# Patient Record
Sex: Female | Born: 1990 | Race: White | Hispanic: No | Marital: Single | Smoking: Current every day smoker
Health system: Southern US, Community
[De-identification: ages and names within clinical notes are randomized; demographics above are authoritative.]

## PROBLEM LIST (undated history)

## (undated) DIAGNOSIS — D649 Anemia, unspecified: Secondary | ICD-10-CM

## (undated) HISTORY — PX: CHOLECYSTECTOMY: SHX55

---

## 2013-05-02 ENCOUNTER — Encounter (HOSPITAL_BASED_OUTPATIENT_CLINIC_OR_DEPARTMENT_OTHER): Payer: Self-pay | Admitting: *Deleted

## 2013-05-02 ENCOUNTER — Emergency Department (HOSPITAL_BASED_OUTPATIENT_CLINIC_OR_DEPARTMENT_OTHER)
Admission: EM | Admit: 2013-05-02 | Discharge: 2013-05-03 | Disposition: A | Discharge status: Home / Self Care | Payer: Self-pay | Attending: Emergency Medicine | Admitting: Emergency Medicine

## 2013-05-02 ENCOUNTER — Emergency Department (HOSPITAL_BASED_OUTPATIENT_CLINIC_OR_DEPARTMENT_OTHER): Payer: Self-pay

## 2013-05-02 DIAGNOSIS — Y939 Activity, unspecified: Secondary | ICD-10-CM | POA: Insufficient documentation

## 2013-05-02 DIAGNOSIS — S90211A Contusion of right great toe with damage to nail, initial encounter: Secondary | ICD-10-CM

## 2013-05-02 DIAGNOSIS — S90129A Contusion of unspecified lesser toe(s) without damage to nail, initial encounter: Secondary | ICD-10-CM | POA: Insufficient documentation

## 2013-05-02 DIAGNOSIS — Z862 Personal history of diseases of the blood and blood-forming organs and certain disorders involving the immune mechanism: Secondary | ICD-10-CM | POA: Insufficient documentation

## 2013-05-02 DIAGNOSIS — F17200 Nicotine dependence, unspecified, uncomplicated: Secondary | ICD-10-CM | POA: Insufficient documentation

## 2013-05-02 DIAGNOSIS — W208XXA Other cause of strike by thrown, projected or falling object, initial encounter: Secondary | ICD-10-CM | POA: Insufficient documentation

## 2013-05-02 DIAGNOSIS — Y9289 Other specified places as the place of occurrence of the external cause: Secondary | ICD-10-CM | POA: Insufficient documentation

## 2013-05-02 DIAGNOSIS — Y990 Civilian activity done for income or pay: Secondary | ICD-10-CM | POA: Insufficient documentation

## 2013-05-02 HISTORY — DX: Anemia, unspecified: D64.9

## 2013-05-02 NOTE — ED Notes (Addendum)
Pt states she dropped something on her toe earlier today. Bruising noted. Ice applied.

## 2013-05-03 MED ORDER — HYDROCODONE-ACETAMINOPHEN 5-325 MG PO TABS: 1.0000 | ORAL_TABLET | Freq: Four times a day (QID) | ORAL | Status: AC | PRN

## 2013-05-03 MED ORDER — HYDROCODONE-ACETAMINOPHEN 5-325 MG PO TABS
1.0000 | ORAL_TABLET | Freq: Once | ORAL | Status: AC
  Administered 2013-05-03: 1 via ORAL
  Filled 2013-05-03: qty 1

## 2013-05-03 NOTE — ED Provider Notes (Signed)
History     CSN: [id number]  Arrival date & time 05/02/13  2136   First MD Initiated Contact with Patient 05/03/13 0108      Chief Complaint  Patient presents with  . Toe Injury    (Consider location/radiation/quality/duration/timing/severity/associated sxs/prior treatment) HPI This is a 22 year old female who dropped a container on her right great toe at work yesterday afternoon. There is mild to moderate pain in that toe, worse with ambulation. There is associated subungual hematoma but no deformity. Motor and sensory function are intact. She denies other injury.  Past Medical History  Diagnosis Date  . Anemia     Past Surgical History  Procedure Laterality Date  . Cholecystectomy      History reviewed. No pertinent family history.  History  Substance Use Topics  . Smoking status: Current Every Day Smoker  . Smokeless tobacco: Not on file  . Alcohol Use: No    OB History   Grav Para Term Preterm Abortions TAB SAB Ect Mult Living                  Review of Systems  All other systems reviewed and are negative.    Allergies  Review of patient's allergies indicates no known allergies.  Home Medications  No current outpatient prescriptions on file.  BP 109/70  Pulse 88  Temp(Src) 98.4 F (36.9 C) (Oral)  Ht 5\' 3"  (1.6 m)  Wt 97 lb (43.999 kg)  BMI 17.19 kg/m2  SpO2 96%  LMP 04/18/2013  Physical Exam General: Well-developed, well-nourished female in no acute distress; appearance consistent with age of record HENT: normocephalic, atraumatic Eyes: Normal appearance Neck: supple Heart: regular rate and rhythm Lungs: clear to auscultation bilaterally Abdomen: soft; nondistended Extremities: No deformity; full range of motion; pulses normal; tender right great toe with subungual hematoma but no swelling, deformity or ecchymosis; toes distally neurovascularly intact Neurologic: Awake, alert and oriented; motor function intact in all extremities and  symmetric; no facial droop Skin: Warm and dry Psychiatric: Anxious    ED Course  Procedures (including critical care time)  DRAINAGE OF SUBUNGUAL HEMATOMA The patient's right great toe subungual hematoma was drained using a standard pen cautery. There was release of a moderate amount of dark blood the patient tolerated this well there were no immediate complications.  MDM  Nursing notes and vitals signs, including pulse oximetry, reviewed.  Summary of this visit's results, reviewed by myself:  Imaging Studies: Dg Toe Great Right  05/02/2013  *RADIOLOGY REPORT*  Clinical Data: First digit pain status post trauma  RIGHT GREAT TOE  Comparison: None.  Findings: No displaced fracture.  No dislocation.  No aggressive osseous lesions.  IMPRESSION: No acute osseous finding of the first digit right foot.  If clinical concern for a fracture persists, recommend a repeat radiograph in 5-10 days to evaluate for interval change or callus formation.   Original Report Authenticated By: Jearld Lesch, M.D.             Hanley Seamen, MD 05/03/13 [phone]

## 2017-01-20 ENCOUNTER — Emergency Department (HOSPITAL_BASED_OUTPATIENT_CLINIC_OR_DEPARTMENT_OTHER)
Admission: EM | Admit: 2017-01-20 | Discharge: 2017-01-21 | Disposition: A | Discharge status: Home / Self Care | Payer: BLUE CROSS/BLUE SHIELD | Attending: Emergency Medicine | Admitting: Emergency Medicine

## 2017-01-20 ENCOUNTER — Encounter (HOSPITAL_BASED_OUTPATIENT_CLINIC_OR_DEPARTMENT_OTHER): Payer: Self-pay | Admitting: Emergency Medicine

## 2017-01-20 DIAGNOSIS — R0789 Other chest pain: Secondary | ICD-10-CM | POA: Diagnosis present

## 2017-01-20 DIAGNOSIS — F17200 Nicotine dependence, unspecified, uncomplicated: Secondary | ICD-10-CM | POA: Diagnosis not present

## 2017-01-20 DIAGNOSIS — M549 Dorsalgia, unspecified: Secondary | ICD-10-CM | POA: Insufficient documentation

## 2017-01-20 DIAGNOSIS — R197 Diarrhea, unspecified: Secondary | ICD-10-CM | POA: Insufficient documentation

## 2017-01-20 DIAGNOSIS — R1110 Vomiting, unspecified: Secondary | ICD-10-CM | POA: Insufficient documentation

## 2017-01-20 DIAGNOSIS — D649 Anemia, unspecified: Secondary | ICD-10-CM | POA: Diagnosis not present

## 2017-01-20 LAB — URINALYSIS, ROUTINE W REFLEX MICROSCOPIC
GLUCOSE, UA: NEGATIVE mg/dL
Ketones, ur: NEGATIVE mg/dL
LEUKOCYTES UA: NEGATIVE
Nitrite: NEGATIVE
PH: 6 (ref 5.0–8.0)
PROTEIN: NEGATIVE mg/dL
Specific Gravity, Urine: 1.029 (ref 1.005–1.030)

## 2017-01-20 LAB — URINALYSIS, MICROSCOPIC (REFLEX)

## 2017-01-20 LAB — PREGNANCY, URINE: Preg Test, Ur: NEGATIVE

## 2017-01-20 NOTE — ED Triage Notes (Signed)
Patient reports that she is having spasms to her chest and to her upper back. The patient states that she has had this going on for the last 3 hours

## 2017-01-21 ENCOUNTER — Emergency Department (HOSPITAL_BASED_OUTPATIENT_CLINIC_OR_DEPARTMENT_OTHER): Payer: BLUE CROSS/BLUE SHIELD

## 2017-01-21 ENCOUNTER — Other Ambulatory Visit: Payer: Self-pay | Admitting: Oncology

## 2017-01-21 DIAGNOSIS — D561 Beta thalassemia: Secondary | ICD-10-CM

## 2017-01-21 DIAGNOSIS — D509 Iron deficiency anemia, unspecified: Secondary | ICD-10-CM

## 2017-01-21 LAB — CBC WITH DIFFERENTIAL/PLATELET
Basophils Absolute: 0 10*3/uL (ref 0.0–0.1)
Basophils Relative: 0 %
EOS PCT: 1 %
Eosinophils Absolute: 0.1 10*3/uL (ref 0.0–0.7)
HEMATOCRIT: 27.6 % — AB (ref 36.0–46.0)
HEMOGLOBIN: 8.5 g/dL — AB (ref 12.0–15.0)
LYMPHS ABS: 0.7 10*3/uL (ref 0.7–4.0)
LYMPHS PCT: 11 %
MCH: 18.4 pg — ABNORMAL LOW (ref 26.0–34.0)
MCHC: 30.8 g/dL (ref 30.0–36.0)
MCV: 59.7 fL — ABNORMAL LOW (ref 78.0–100.0)
Monocytes Absolute: 0.3 10*3/uL (ref 0.1–1.0)
Monocytes Relative: 5 %
NEUTROS PCT: 83 %
Neutro Abs: 5.2 10*3/uL (ref 1.7–7.7)
Platelets: 353 10*3/uL (ref 150–400)
RBC: 4.62 MIL/uL (ref 3.87–5.11)
RDW: 22 % — ABNORMAL HIGH (ref 11.5–15.5)
WBC: 6.3 10*3/uL (ref 4.0–10.5)

## 2017-01-21 LAB — COMPREHENSIVE METABOLIC PANEL
ALBUMIN: 4.8 g/dL (ref 3.5–5.0)
ALK PHOS: 41 U/L (ref 38–126)
ALT: 21 U/L (ref 14–54)
AST: 23 U/L (ref 15–41)
Anion gap: 8 (ref 5–15)
BILIRUBIN TOTAL: 1.5 mg/dL — AB (ref 0.3–1.2)
BUN: 13 mg/dL (ref 6–20)
CALCIUM: 8.9 mg/dL (ref 8.9–10.3)
CO2: 26 mmol/L (ref 22–32)
Chloride: 102 mmol/L (ref 101–111)
Creatinine, Ser: 0.5 mg/dL (ref 0.44–1.00)
GFR calc Af Amer: >60 mL/min (ref >60)
GFR calc non Af Amer: >60 mL/min (ref >60)
GLUCOSE: 111 mg/dL — AB (ref 65–99)
Potassium: 3.8 mmol/L (ref 3.5–5.1)
Sodium: 136 mmol/L (ref 135–145)
Total Protein: 8.2 g/dL — ABNORMAL HIGH (ref 6.5–8.1)

## 2017-01-21 LAB — IRON AND TIBC
Iron: 50 ug/dL (ref 28–170)
Saturation Ratios: 16 % (ref 10.4–31.8)
TIBC: 305 ug/dL (ref 250–450)
UIBC: 255 ug/dL

## 2017-01-21 LAB — D-DIMER, QUANTITATIVE: D-Dimer, Quant: <0.27 ug/mL-FEU (ref 0.00–0.50)

## 2017-01-21 LAB — FERRITIN: Ferritin: 66 ng/mL (ref 11–307)

## 2017-01-21 LAB — VITAMIN B12: Vitamin B-12: 420 pg/mL (ref 180–914)

## 2017-01-21 LAB — DIRECT ANTIGLOBULIN TEST (NOT AT ARMC)
DAT, IgG: NEGATIVE
DAT, complement: NEGATIVE

## 2017-01-21 LAB — LACTATE DEHYDROGENASE: LDH: 122 U/L (ref 98–192)

## 2017-01-21 LAB — RETICULOCYTES
RBC.: 4.72 MIL/uL (ref 3.87–5.11)
RETIC COUNT ABSOLUTE: 160.5 10*3/uL (ref 19.0–186.0)
Retic Ct Pct: 3.4 % — ABNORMAL HIGH (ref 0.4–3.1)

## 2017-01-21 LAB — LIPASE, BLOOD: Lipase: 18 U/L (ref 11–51)

## 2017-01-21 LAB — FOLATE: FOLATE: 6.6 ng/mL (ref >5.9)

## 2017-01-21 MED ORDER — ONDANSETRON HCL 4 MG PO TABS: 4.0000 mg | ORAL_TABLET | Freq: Four times a day (QID) | ORAL | 0 refills | Status: AC

## 2017-01-21 NOTE — ED Notes (Signed)
Pt able to drink without vomiting. Denies pain.

## 2017-01-21 NOTE — Discharge Instructions (Signed)
There is no evidence of heart attack or blood clot in the lung. Follow up with the hematologist regarding your anemia. Return to the ED if you develop new or worsening symptoms.

## 2017-01-21 NOTE — ED Provider Notes (Signed)
[facility] Provider Note   CSN: [id number] Arrival date & time: 01/20/17  2153  By signing my name below, I, Teofilo Pod, attest that this documentation has been prepared under the direction and in the presence of Glynn Octave, MD . Electronically Signed: Teofilo Pod, ED Scribe. 01/21/2017. 12:08 AM.    History   Chief Complaint Chief Complaint  Patient presents with  . Flank Pain     The history is provided by the patient. No language interpreter was used.   HPI Comments:  Pamela Tyler is a 26 y.o. female who presents to the Emergency Department complaining of intermittent back and chest pain x 3 days. Pt complains of associated diarrhea (1 x today, 4 x yesterday), vomiting (3 x  Today). Pt describes the pain as "sharp cramps," notes that it is made worse with breathing, and states that it lasts for 15-20 minute episodes. No alleviating factors noted.  Pt reports sick contact with her children. Pt denies recent long travel, antibiotic use, birth control. Pt reports PSHx of cholecystectomy. Pt denies cough, rhinorrhea, sore throat, dysuria, hematuria, SOB, abdominal pain, vaginal bleeding, vaginal discharge.    Past Medical History:  Diagnosis Date  . Anemia     There are no active problems to display for this patient.   Past Surgical History:  Procedure Laterality Date  . CHOLECYSTECTOMY      OB History    No data available       Home Medications    Prior to Admission medications   Medication Sig Start Date End Date Taking? Authorizing Provider  HYDROcodone-acetaminophen (NORCO/VICODIN) 5-325 MG per tablet Take 1-2 tablets by mouth every 6 (six) hours as needed for pain. 05/03/13   Paula Libra, MD    Family History History reviewed. No pertinent family history.  Social History Social History  Substance Use Topics  . Smoking status: Current Every Day Smoker  . Smokeless tobacco: Never Used  . Alcohol use No      Allergies   Patient has no known allergies.   Review of Systems Review of Systems  HENT: Negative for rhinorrhea and sore throat.   Respiratory: Negative for cough and shortness of breath.   Cardiovascular: Positive for chest pain.  Gastrointestinal: Positive for diarrhea and vomiting. Negative for abdominal pain.  Genitourinary: Negative for dysuria, hematuria, vaginal bleeding and vaginal discharge.  Musculoskeletal: Positive for back pain.  All other systems reviewed and are negative.    Physical Exam Updated Vital Signs BP 103/82 (BP Location: Left Arm)   Pulse 99   Temp 98.4 F (36.9 C) (Oral)   Resp 22   Ht 5\' 3"  (1.6 m)   Wt 96 lb (43.5 kg)   LMP 01/13/2017   SpO2 100%   BMI 17.01 kg/m   Physical Exam  Constitutional: She is oriented to person, place, and time. She appears well-developed and well-nourished. No distress.  HENT:  Head: Normocephalic and atraumatic.  Mouth/Throat: Oropharynx is clear and moist. No oropharyngeal exudate.  Eyes: Conjunctivae and EOM are normal. Pupils are equal, round, and reactive to light.  Neck: Normal range of motion. Neck supple.  No meningismus.  Cardiovascular: Normal rate, regular rhythm, normal heart sounds and intact distal pulses.   No murmur heard. Pulmonary/Chest: Effort normal and breath sounds normal. No respiratory distress.  Abdominal: Soft. There is no tenderness. There is no rebound and no guarding.  Musculoskeletal: Normal range of motion. She exhibits no edema or tenderness.  Paraspinal thoracic  tenderness, equal grip strength, radial pulses intact.  Neurological: She is alert and oriented to person, place, and time. No cranial nerve deficit. She exhibits normal muscle tone. Coordination normal.  No ataxia on finger to nose bilaterally. No pronator drift. 5/5 strength throughout. CN 2-12 intact.Equal grip strength. Sensation intact.   Skin: Skin is warm.  Psychiatric: She has a normal mood and affect. Her  behavior is normal.  Nursing note and vitals reviewed.    ED Treatments / Results  DIAGNOSTIC STUDIES:  Oxygen Saturation is 100% on RA, normal by my interpretation.    COORDINATION OF CARE:  12:05 AM Discussed treatment plan with pt at bedside and pt agreed to plan.   Labs (all labs ordered are listed, but only abnormal results are displayed) Labs Reviewed  URINALYSIS, ROUTINE W REFLEX MICROSCOPIC - Abnormal; Notable for the following:       Result Value   Color, Urine AMBER (*)    Hgb urine dipstick TRACE (*)    Bilirubin Urine SMALL (*)    All other components within normal limits  URINALYSIS, MICROSCOPIC (REFLEX) - Abnormal; Notable for the following:    Bacteria, UA FEW (*)    Squamous Epithelial / LPF 0-5 (*)    All other components within normal limits  CBC WITH DIFFERENTIAL/PLATELET - Abnormal; Notable for the following:    Hemoglobin 8.5 (*)    HCT 27.6 (*)    MCV 59.7 (*)    MCH 18.4 (*)    RDW 22.0 (*)    All other components within normal limits  COMPREHENSIVE METABOLIC PANEL - Abnormal; Notable for the following:    Glucose, Bld 111 (*)    Total Protein 8.2 (*)    Total Bilirubin 1.5 (*)    All other components within normal limits  PREGNANCY, URINE  LIPASE, BLOOD  D-DIMER, QUANTITATIVE (NOT AT [facility])  LACTATE DEHYDROGENASE  VITAMIN B12  FOLATE  IRON AND TIBC  FERRITIN  RETICULOCYTES  HAPTOGLOBIN  DIRECT ANTIGLOBULIN TEST (NOT AT [facility])    EKG  EKG Interpretation  Date/Time:  Tuesday January 21 2017 00:27:58 EST Ventricular Rate:  70 PR Interval:    QRS Duration: 93 QT Interval:  390 QTC Calculation: 421 R Axis:   103 Text Interpretation:  Sinus rhythm Borderline right axis deviation No previous ECGs available Confirmed by Manus Gunning  MD, Enda Santo [phone]) on 01/21/2017 12:36:06 AM       Radiology Dg Chest 2 View  Result Date: 01/21/2017 CLINICAL DATA:  Acute onset of intermittent central chest pain and back pain. Nausea, vomiting and  diarrhea. Initial encounter. EXAM: CHEST  2 VIEW COMPARISON:  Chest radiograph performed 03/12/2010 FINDINGS: The lungs are well-aerated and clear. There is no evidence of focal opacification, pleural effusion or pneumothorax. The heart is borderline normal in size. No acute osseous abnormalities are seen. IMPRESSION: No acute cardiopulmonary process seen. Electronically Signed   By: Roanna Raider M.D.   On: 01/21/2017 00:43    Procedures Procedures (including critical care time)  Medications Ordered in ED Medications - No data to display   Initial Impression / Assessment and Plan / ED Course  I have reviewed the triage vital signs and the nursing notes.  Pertinent labs & imaging results that were available during my care of the patient were reviewed by me and considered in my medical decision making (see chart for details).     Patient reports 2 days of vomiting and diarrhea. Today she developed spasms to her chest and  upper back onset after vomiting. They come and go and last a few seconds at a time. Associated with shortness of breath.  No distress. Lungs clear. EKG normal sinus rhythm Hemoglobin 8.5. No comparison.  This is consistent with previous values in care everywhere from June 2017. CXR negative. D-dimer negative.  Patient does have target cells, lymphocytes, schistocytes on her smear. She also has mildly elevated bilirubin of 1.5. Discussed with Dr. Darnelle CatalanMagrinat hematology who favors thalassemia and not a hemolytic anemia. He will see in the office tomorrow. He requests additional tests for anemia as well as haptoglobin and LDH.  Patient's chest pain is atypical for ACS.  Doubt PE, doubt aortic dissection.Follow up with PCP and hematology. Return precautions discussed.  Final Clinical Impressions(s) / ED Diagnoses   Final diagnoses:  Atypical chest pain  Anemia, unspecified type    New Prescriptions New Prescriptions   No medications on file  I personally performed the  services described in this documentation, which was scribed in my presence. The recorded information has been reviewed and is accurate.     Glynn OctaveStephen Ediberto Sens, MD 01/21/17 [phone]

## 2017-01-22 ENCOUNTER — Encounter: Payer: Self-pay | Admitting: Oncology

## 2017-01-22 ENCOUNTER — Telehealth: Payer: Self-pay | Admitting: Oncology

## 2017-01-22 LAB — HAPTOGLOBIN: Haptoglobin: 60 mg/dL (ref 34–200)

## 2017-01-22 NOTE — Telephone Encounter (Signed)
Made multiple attempts to contact the pt for an appt w/Dr. Darnelle CatalanMagrinat. Per Dr. Darrall DearsMagrinat's request, an appt for labs was made on 2/12 at 12pm and an MD appt on 2/14 at 430pm. Letter mailed to the pt to inform of the appts scheduled.

## 2017-02-03 ENCOUNTER — Other Ambulatory Visit: Payer: BLUE CROSS/BLUE SHIELD

## 2017-02-12 ENCOUNTER — Ambulatory Visit: Payer: BLUE CROSS/BLUE SHIELD | Admitting: Oncology

## 2017-02-12 ENCOUNTER — Encounter: Payer: Self-pay | Admitting: Oncology

## 2017-07-29 IMAGING — DX DG CHEST 2V
2 series · 2 of 2 positions shown · non-contrast
Comparison: Chest radiograph performed 03/12/2010

CLINICAL DATA: Acute onset of intermittent central chest pain and
back pain. Nausea, vomiting and diarrhea. Initial encounter.

EXAM:
CHEST  2 VIEW

[chest pa]
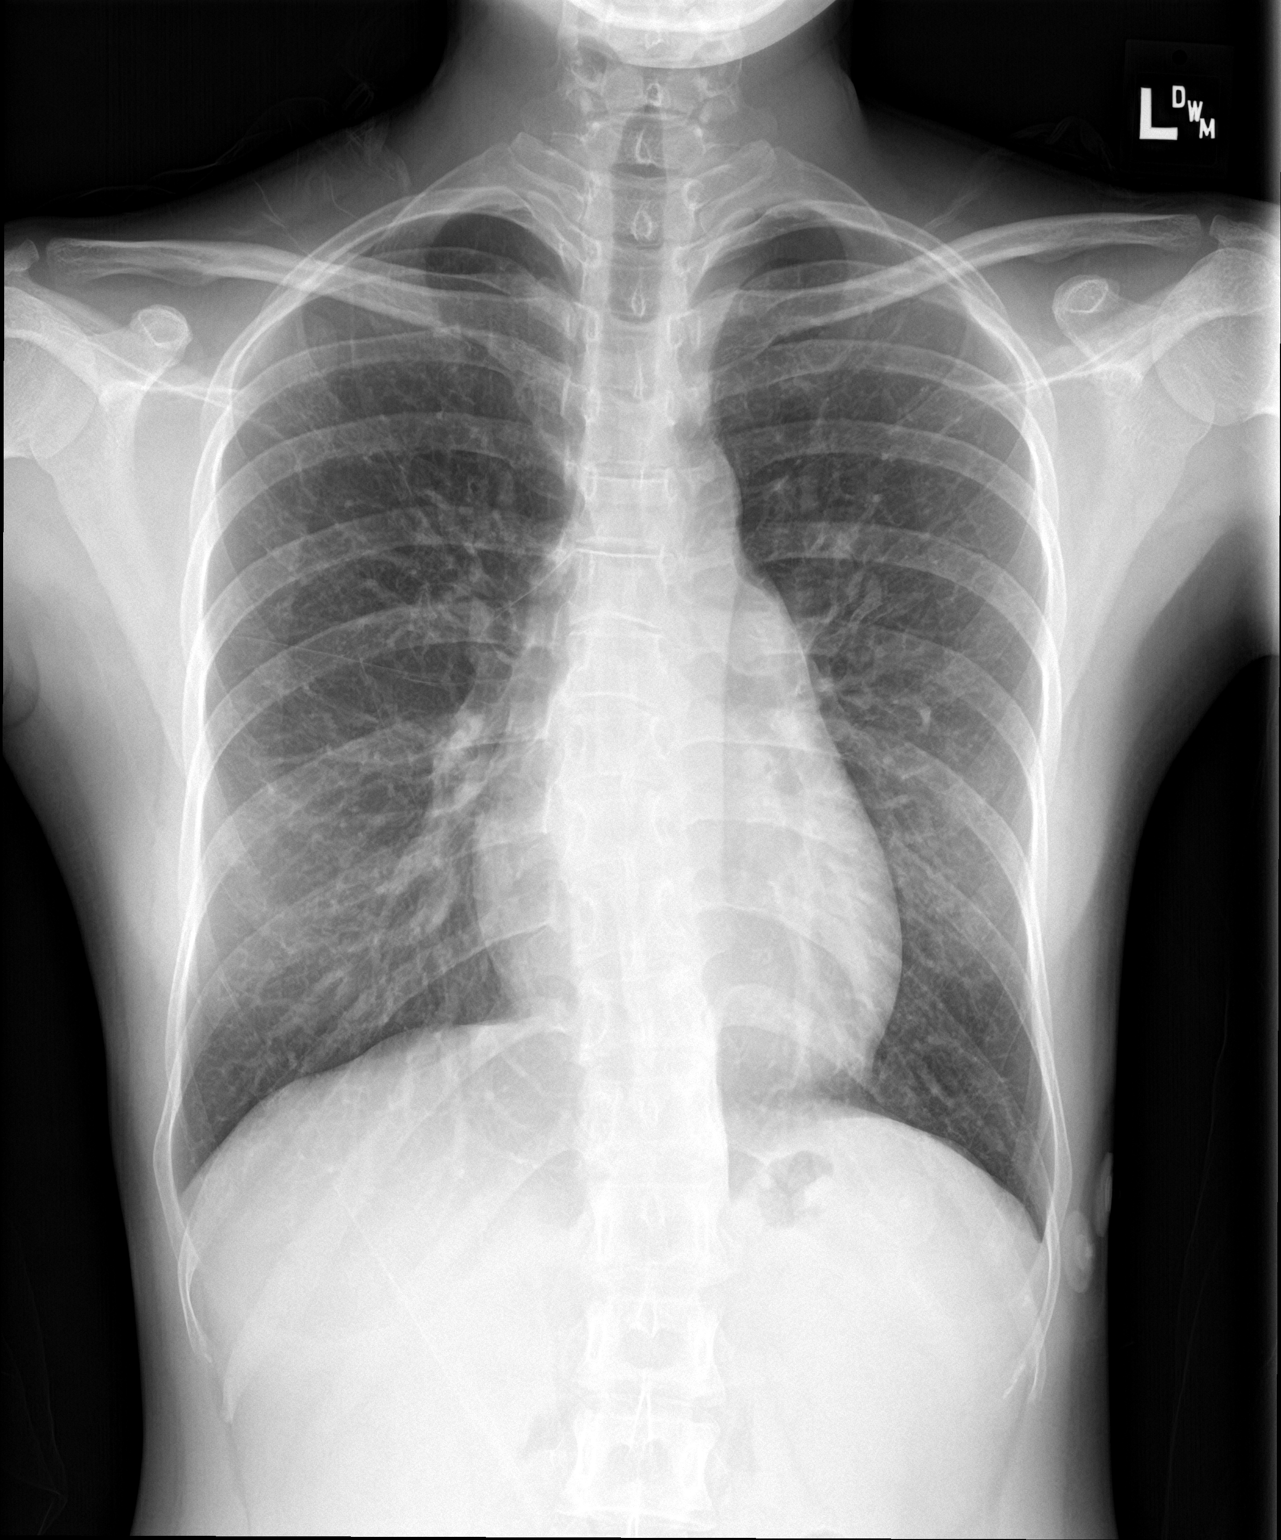

[chest lat]
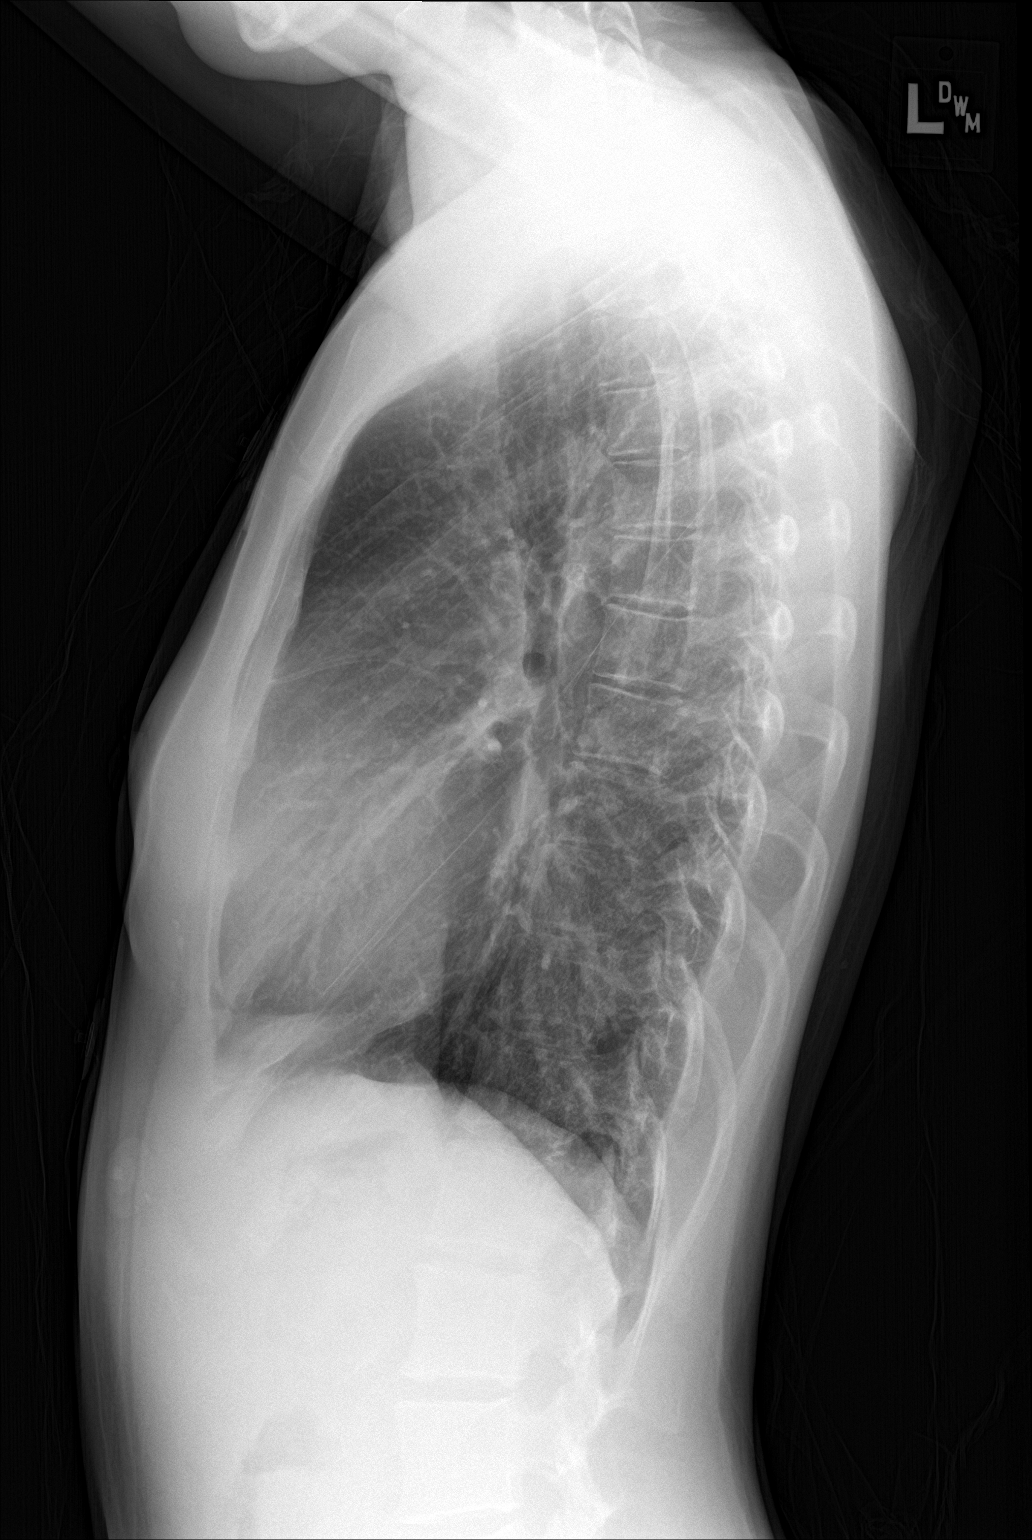

[2 of 2 positions shown; findings below may reference images not displayed]

FINDINGS: The lungs are well-aerated and clear. There is no evidence of focal
opacification, pleural effusion or pneumothorax.

The heart is borderline normal in size. No acute osseous
abnormalities are seen.
IMPRESSION: No acute cardiopulmonary process seen.

## 2019-10-05 ENCOUNTER — Other Ambulatory Visit: Payer: Self-pay

## 2019-10-05 DIAGNOSIS — Z20822 Contact with and (suspected) exposure to covid-19: Secondary | ICD-10-CM

## 2019-10-06 LAB — NOVEL CORONAVIRUS, NAA: SARS-CoV-2, NAA: NOT DETECTED
